# Patient Record
Sex: Male | Born: 1995 | Race: Black or African American | Hispanic: No | Marital: Single | Smoking: Never smoker
Health system: Southern US, Community
[De-identification: ages and names within clinical notes are randomized; demographics above are authoritative.]

---
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
## 2018-12-02 ENCOUNTER — Emergency Department (HOSPITAL_BASED_OUTPATIENT_CLINIC_OR_DEPARTMENT_OTHER)
Admission: EM | Admit: 2018-12-02 | Discharge: 2018-12-02 | Disposition: A | Discharge status: Home / Self Care | Payer: 59 | Attending: Emergency Medicine | Admitting: Emergency Medicine

## 2018-12-02 ENCOUNTER — Other Ambulatory Visit: Payer: Self-pay

## 2018-12-02 ENCOUNTER — Encounter (HOSPITAL_BASED_OUTPATIENT_CLINIC_OR_DEPARTMENT_OTHER): Payer: Self-pay | Admitting: *Deleted

## 2018-12-02 ENCOUNTER — Emergency Department (HOSPITAL_BASED_OUTPATIENT_CLINIC_OR_DEPARTMENT_OTHER): Payer: 59

## 2018-12-02 DIAGNOSIS — J069 Acute upper respiratory infection, unspecified: Secondary | ICD-10-CM | POA: Insufficient documentation

## 2018-12-02 DIAGNOSIS — R05 Cough: Secondary | ICD-10-CM | POA: Diagnosis present

## 2018-12-02 DIAGNOSIS — Z79899 Other long term (current) drug therapy: Secondary | ICD-10-CM | POA: Diagnosis not present

## 2018-12-02 DIAGNOSIS — B9789 Other viral agents as the cause of diseases classified elsewhere: Secondary | ICD-10-CM

## 2018-12-02 MED ORDER — ALBUTEROL SULFATE HFA 108 (90 BASE) MCG/ACT IN AERS: 1.0000 | INHALATION_SPRAY | Freq: Four times a day (QID) | RESPIRATORY_TRACT | 0 refills | Status: AC | PRN

## 2018-12-02 MED ORDER — BENZONATATE 100 MG PO CAPS: 100.0000 mg | ORAL_CAPSULE | Freq: Three times a day (TID) | ORAL | 0 refills | Status: AC

## 2018-12-02 MED ORDER — CETIRIZINE-PSEUDOEPHEDRINE ER 5-120 MG PO TB12: 1.0000 | ORAL_TABLET | Freq: Two times a day (BID) | ORAL | 0 refills | Status: AC

## 2018-12-02 MED ORDER — METHYLPREDNISOLONE 4 MG PO TBPK: ORAL_TABLET | ORAL | 0 refills | Status: AC

## 2018-12-02 MED ORDER — ALBUTEROL SULFATE (2.5 MG/3ML) 0.083% IN NEBU: 5.0000 mg | INHALATION_SOLUTION | Freq: Once | RESPIRATORY_TRACT | Status: DC

## 2018-12-02 NOTE — Discharge Instructions (Addendum)
Take Medrol Dosepak until completed.  Take Tessalon every 8 hours as needed for cough.  Take Zyrtec-D as prescribed for nasal congestion.  Use albuterol inhaler every 4-6 hours as needed for shortness of breath or wheezing.  Please return the emergency department if you develop any new or worsening symptoms.

## 2018-12-02 NOTE — ED Triage Notes (Signed)
Cough x 2 weeks productive for "a lot of mucous"

## 2018-12-02 NOTE — ED Provider Notes (Signed)
[facility] EMERGENCY DEPARTMENT Provider Note   CSN: [id number] Arrival date & time: 12/02/18  1600     History   Chief Complaint Chief Complaint  Patient presents with  . Cough    HPI Shane Holt is a 22 y.o. male who is previously healthy who presents with a cough for 2 weeks.  He has had associated nasal congestion and intermittent sore throat.  Patient reports he started off having fevers, however has not had that anymore.  His cough is productive.  He denies any shortness of breath, chest pain, abdominal pain.  He has had some intermittent nausea, but no vomiting.  He has been taking over-the-counter medication without significant relief.  HPI  History reviewed. No pertinent past medical history.  There are no active problems to display for this patient.   History reviewed. No pertinent surgical history.      Home Medications    Prior to Admission medications   Medication Sig Start Date End Date Taking? Authorizing Provider  albuterol (PROVENTIL HFA;VENTOLIN HFA) 108 (90 Base) MCG/ACT inhaler Inhale 1-2 puffs into the lungs every 6 (six) hours as needed for wheezing or shortness of breath. 12/02/18   Cheetara Hoge, Waylan BogaAlexandra M, PA-C  benzonatate (TESSALON) 100 MG capsule Take 1 capsule (100 mg total) by mouth every 8 (eight) hours. 12/02/18   Petra Dumler, Waylan BogaAlexandra M, PA-C  cetirizine (ZYRTEC) 10 MG tablet Take 1 tablet (10 mg total) by mouth daily. 03/29/15   Mabe, Latanya MaudlinMartha L, MD  cetirizine-pseudoephedrine (ZYRTEC-D) 5-120 MG tablet Take 1 tablet by mouth 2 (two) times daily. 12/02/18   Vianne Grieshop, Waylan BogaAlexandra M, PA-C  ibuprofen (ADVIL,MOTRIN) 200 MG tablet Take 200 mg by mouth every 6 (six) hours as needed for moderate pain.    [provider]  methylPREDNISolone (MEDROL DOSEPAK) 4 MG TBPK tablet Take as directed on package. 12/02/18   Emi HolesLaw, Evaleen Sant M, PA-C    Family History No family history on file.  Social History Social History   Tobacco Use  . Smoking status:  Never Smoker  . Smokeless tobacco: Never Used  Substance Use Topics  . Alcohol use: No  . Drug use: No     Allergies   Patient has no known allergies.   Review of Systems Review of Systems  Constitutional: Positive for fever (resolved). Negative for chills.  HENT: Positive for congestion and sore throat. Negative for ear pain and facial swelling.   Respiratory: Positive for cough. Negative for shortness of breath.   Cardiovascular: Negative for chest pain.  Gastrointestinal: Positive for nausea. Negative for abdominal pain and vomiting.  Genitourinary: Negative for dysuria.  Musculoskeletal: Negative for back pain.  Skin: Negative for rash and wound.  Neurological: Negative for headaches.  Psychiatric/Behavioral: The patient is not nervous/anxious.      Physical Exam Updated Vital Signs BP (!) 144/85 (BP Location: Right Arm)   Pulse 82   Temp 98.6 F (37 C) (Oral)   Resp 18   Ht 6' (1.829 m)   Wt 72 kg   SpO2 100%   BMI 21.53 kg/m   Physical Exam  Constitutional: He appears well-developed and well-nourished. No distress.  HENT:  Head: Normocephalic and atraumatic.  Right Ear: Tympanic membrane normal.  Left Ear: Tympanic membrane normal.  Mouth/Throat: Oropharynx is clear and moist. No oropharyngeal exudate, posterior oropharyngeal edema, posterior oropharyngeal erythema or tonsillar abscesses. Tonsils are 0 on the right. Tonsils are 0 on the left. No tonsillar exudate.  No facial tenderness  Eyes: Pupils  are equal, round, and reactive to light. Conjunctivae are normal. Right eye exhibits no discharge. Left eye exhibits no discharge. No scleral icterus.  Neck: Normal range of motion. Neck supple. No thyromegaly present.  Cardiovascular: Normal rate, regular rhythm, normal heart sounds and intact distal pulses. Exam reveals no gallop and no friction rub.  No murmur heard. Pulmonary/Chest: Effort normal and breath sounds normal. No stridor. No respiratory distress.  He has no wheezes. He has no rales.  Abdominal: Soft. Bowel sounds are normal. He exhibits no distension. There is no tenderness. There is no rebound and no guarding.  Musculoskeletal: He exhibits no edema.  Lymphadenopathy:    He has no cervical adenopathy.  Neurological: He is alert. Coordination normal.  Skin: Skin is warm and dry. No rash noted. He is not diaphoretic. No pallor.  Psychiatric: He has a normal mood and affect.  Nursing note and vitals reviewed.    ED Treatments / Results  Labs (all labs ordered are listed, but only abnormal results are displayed) Labs Reviewed - No data to display  EKG None  Radiology Dg Chest 2 View  Result Date: 12/02/2018 CLINICAL DATA:  Cough and intermittent fevers for 2 weeks. EXAM: CHEST - 2 VIEW COMPARISON:  08/08/1999 FINDINGS: The heart size and mediastinal contours are within normal limits. Both lungs are clear. The visualized skeletal structures are unremarkable. IMPRESSION: Negative.  No active cardiopulmonary disease. Electronically Signed   By: Myles Rosenthal M.D.   On: 12/02/2018 16:35    Procedures Procedures (including critical care time)  Medications Ordered in ED Medications - No data to display   Initial Impression / Assessment and Plan / ED Course  I have reviewed the triage vital signs and the nursing notes.  Pertinent labs & imaging results that were available during my care of the patient were reviewed by me and considered in my medical decision making (see chart for details).     Patient with probable viral URI.  X-ray is negative.  Lungs are clear.  Patient has no significant facial tenderness.  Patient will be given Medrol Dosepak, Tessalon, Zyrtec-D as well as inhaler.  Patient has no wheezing, however he does have history of asthma as a child.  Will provide inhaler Rx as needed.  Return precautions discussed.  Patient understands and agrees with plan.  Patient vitals stable throughout ED course and discharged in  satisfactory condition.  Final Clinical Impressions(s) / ED Diagnoses   Final diagnoses:  Viral URI with cough    ED Discharge Orders         Ordered    methylPREDNISolone (MEDROL DOSEPAK) 4 MG TBPK tablet     12/02/18 1735    albuterol (PROVENTIL HFA;VENTOLIN HFA) 108 (90 Base) MCG/ACT inhaler  Every 6 hours PRN     12/02/18 1735    benzonatate (TESSALON) 100 MG capsule  Every 8 hours     12/02/18 1735    cetirizine-pseudoephedrine (ZYRTEC-D) 5-120 MG tablet  2 times daily     12/02/18 1737           Shyheim Tanney, Waylan Boga, PA-C 12/02/18 1913    Arby Barrette, MD 12/04/18 0025
